# Patient Record
Sex: Male | Born: 1975 | Race: White | Hispanic: No | Marital: Married | Smoking: Never smoker
Health system: Southern US, Community
[De-identification: ages and names within clinical notes are randomized; demographics above are authoritative.]

## PROBLEM LIST (undated history)

## (undated) HISTORY — PX: APPENDECTOMY: SHX54

---

## 1999-08-05 ENCOUNTER — Emergency Department (HOSPITAL_COMMUNITY): Admission: EM | Admit: 1999-08-05 | Discharge: 1999-08-05 | Payer: Self-pay | Admitting: Emergency Medicine

## 1999-08-05 ENCOUNTER — Encounter: Payer: Self-pay | Admitting: Emergency Medicine

## 2016-07-15 ENCOUNTER — Other Ambulatory Visit: Payer: Self-pay | Admitting: Physician Assistant

## 2016-07-15 DIAGNOSIS — M5442 Lumbago with sciatica, left side: Principal | ICD-10-CM

## 2016-07-15 DIAGNOSIS — M5441 Lumbago with sciatica, right side: Secondary | ICD-10-CM

## 2016-07-18 ENCOUNTER — Ambulatory Visit
Admission: RE | Admit: 2016-07-18 | Discharge: 2016-07-18 | Disposition: A | Discharge status: Home / Self Care | Payer: Worker's Compensation | Source: Ambulatory Visit | Attending: Physician Assistant | Admitting: Physician Assistant

## 2016-07-18 DIAGNOSIS — M5441 Lumbago with sciatica, right side: Secondary | ICD-10-CM

## 2016-07-18 DIAGNOSIS — M5442 Lumbago with sciatica, left side: Principal | ICD-10-CM

## 2016-10-02 ENCOUNTER — Other Ambulatory Visit: Payer: Self-pay | Admitting: Physical Medicine and Rehabilitation

## 2016-10-02 DIAGNOSIS — S335XXA Sprain of ligaments of lumbar spine, initial encounter: Secondary | ICD-10-CM

## 2016-10-12 ENCOUNTER — Ambulatory Visit
Admission: RE | Admit: 2016-10-12 | Discharge: 2016-10-12 | Disposition: A | Discharge status: Home / Self Care | Payer: Worker's Compensation | Source: Ambulatory Visit | Attending: Physical Medicine and Rehabilitation | Admitting: Physical Medicine and Rehabilitation

## 2016-10-12 DIAGNOSIS — S335XXA Sprain of ligaments of lumbar spine, initial encounter: Secondary | ICD-10-CM

## 2017-10-09 IMAGING — MR MR LUMBAR SPINE W/O CM
4 of 5 series · 26 of 48 positions shown · non-contrast
Comparison: Lumbar spine MRI 07/18/2016

CLINICAL DATA: Fall with low back pain

EXAM:
MRI LUMBAR SPINE WITHOUT CONTRAST
TECHNIQUE: Multiplanar, multisequence MR imaging of the lumbar spine was
performed. No intravenous contrast was administered.

[Series 3: T2 post-contrast · sagittal · 4.0mm · 0.55mm/px · 6 of 15 slices shown]
[im 1/15]
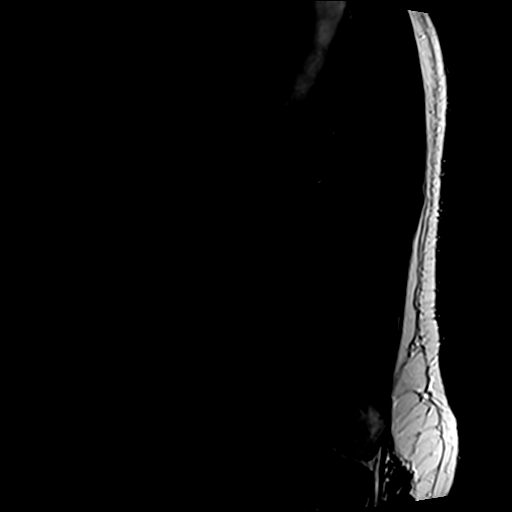
[im 3/15]
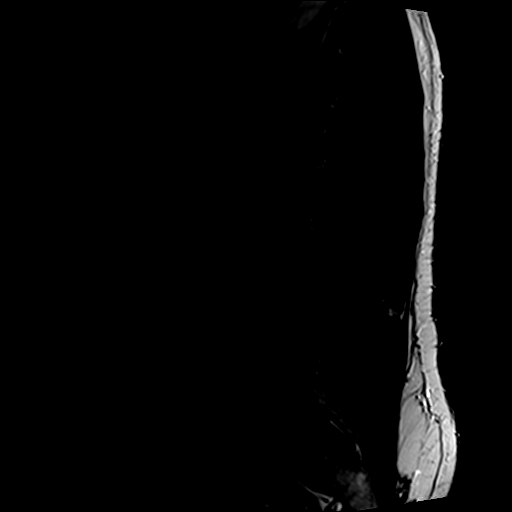
[im 6/15]
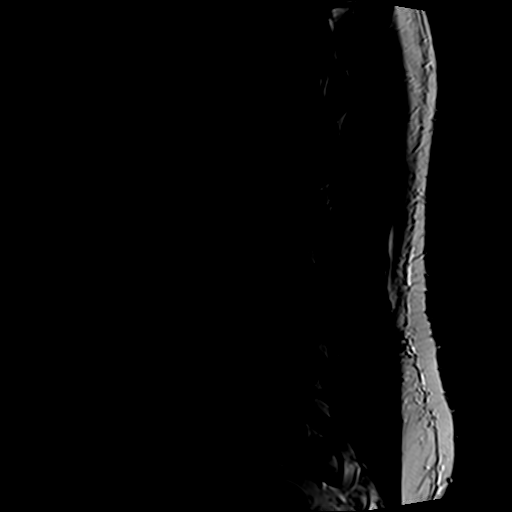
[im 9/15]
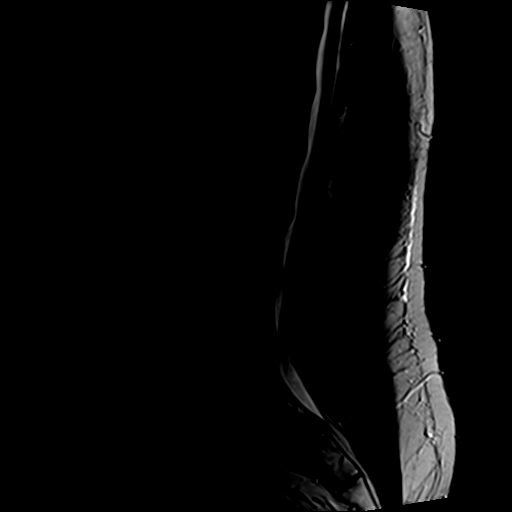
[im 12/15]
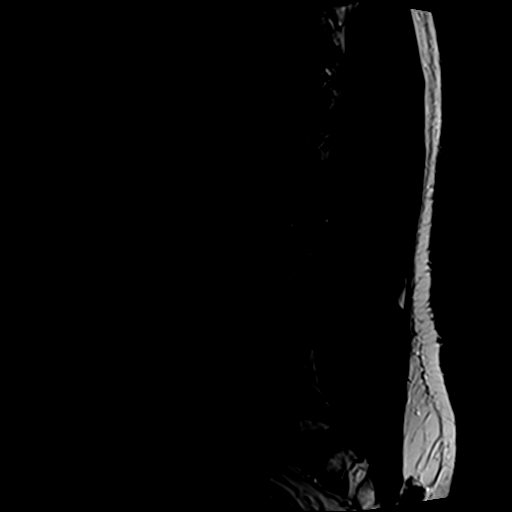
[im 15/15]
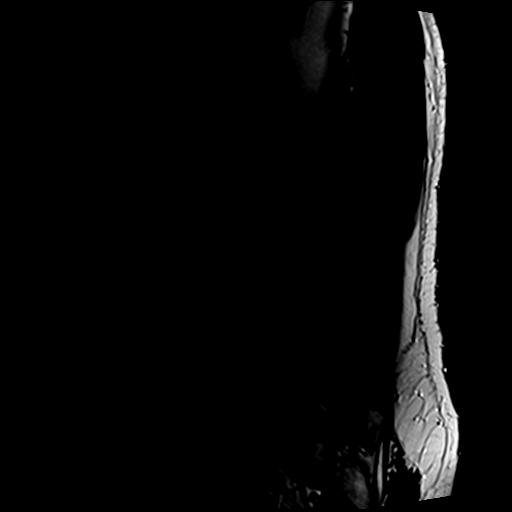

[Series 5: T1 · sagittal · 4.0mm · 0.55mm/px · 6 of 15 slices shown (1 of 2)]
[im 1/15]
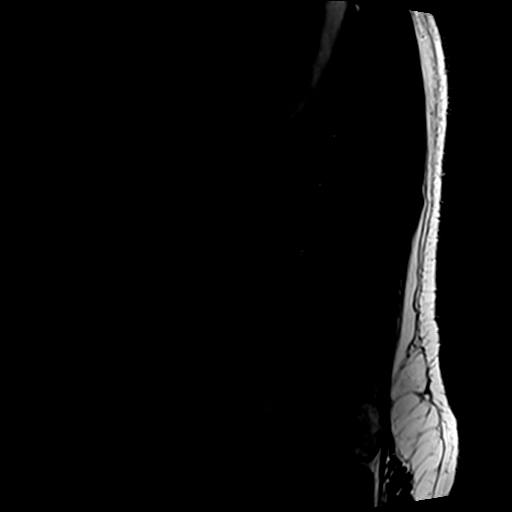
[im 3/15]
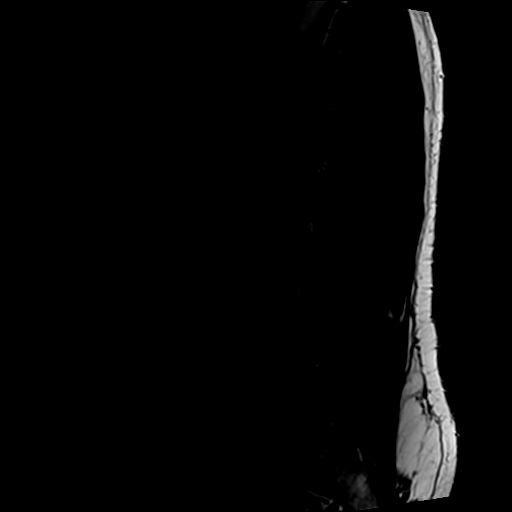
[im 6/15]
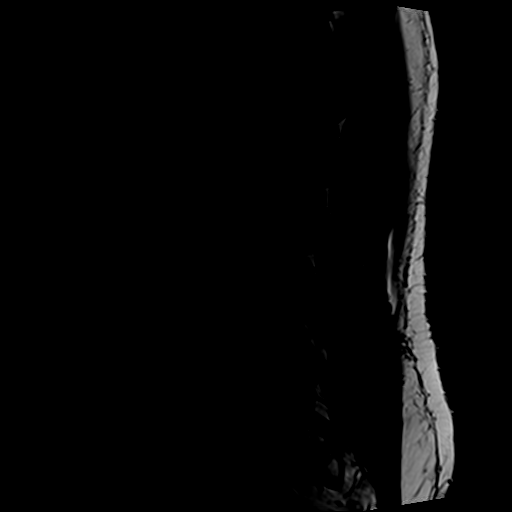
[im 9/15]
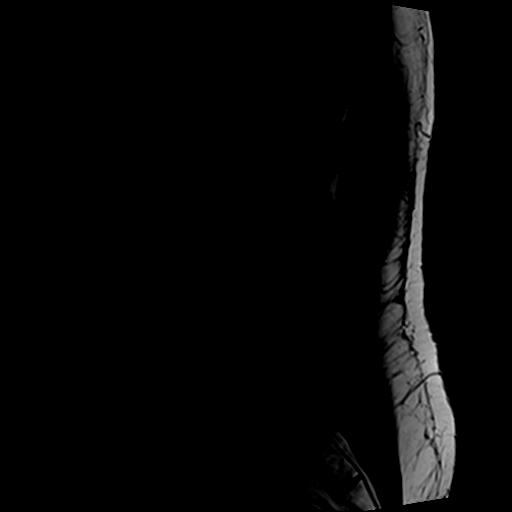
[im 12/15]
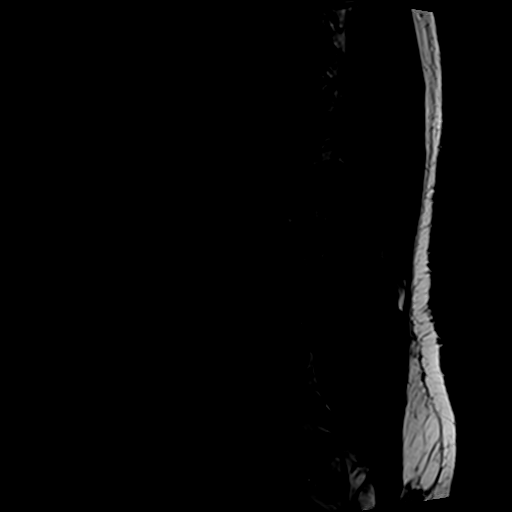
[im 15/15]
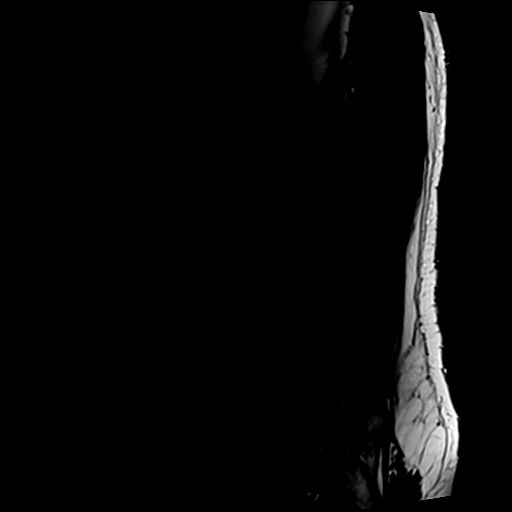

[Series 6: T1 · axial · 4.0mm · 0.35mm/px · z∈[-58,+104]mm · 5 of 35 slices shown (2 of 2)]
[im 1/35]
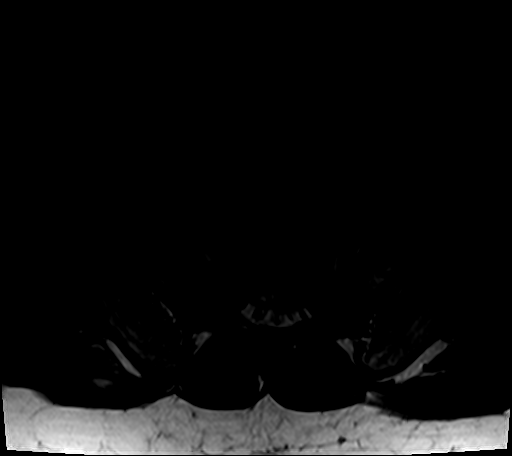
[im 5/35]
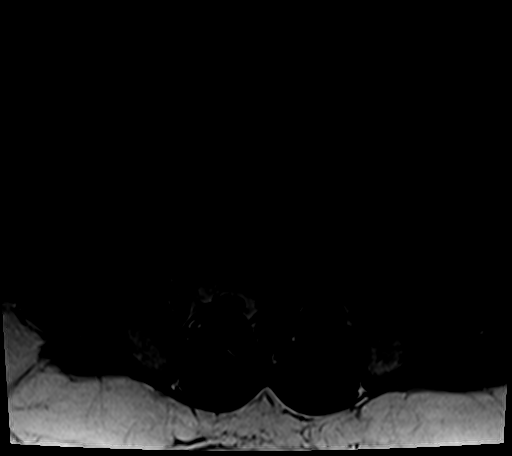
[im 10/35]
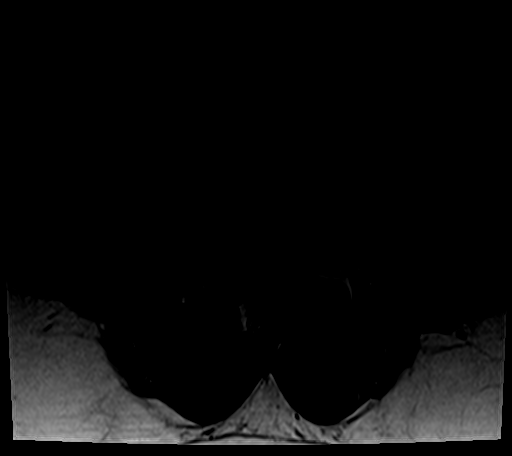
[im 18/35]
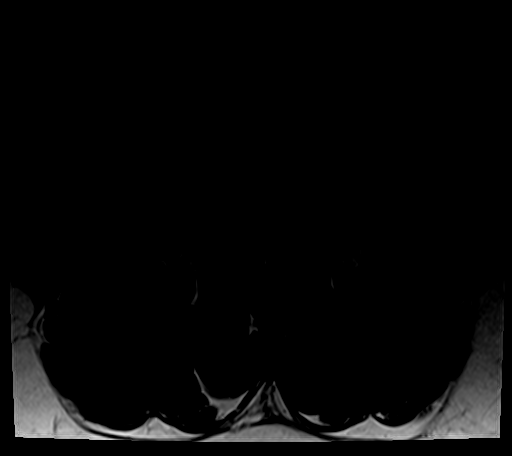
[im 30/35]
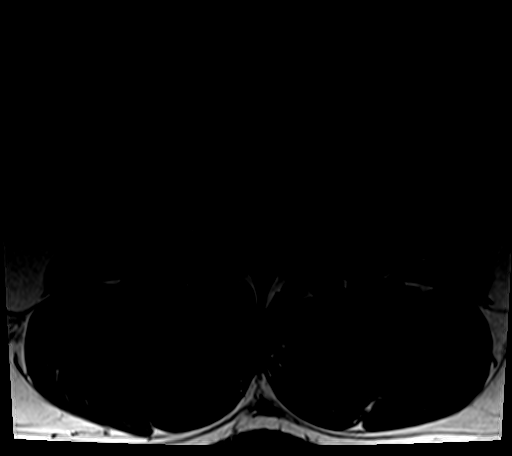

[Series 7: T2 · axial · 4.0mm · 0.70mm/px · z∈[-58,+130]mm · 9 of 35 slices shown]
[im 1/35]
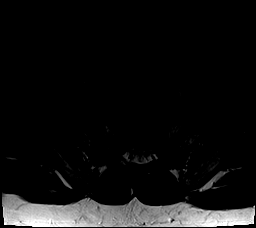
[im 5/35]
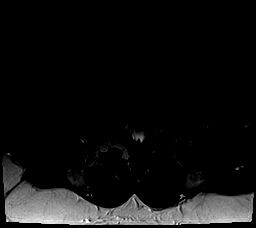
[im 10/35]
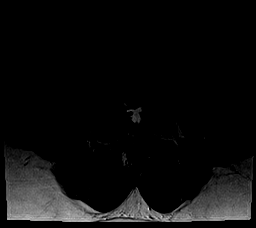
[im 15/35]
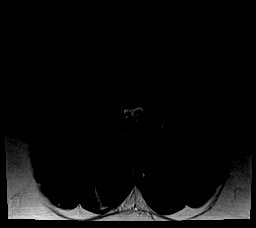
[im 18/35]
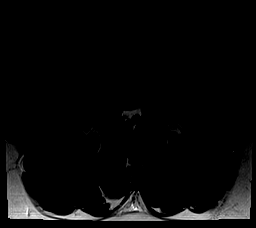
[im 20/35]
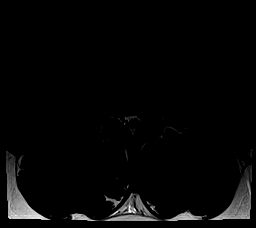
[im 25/35]
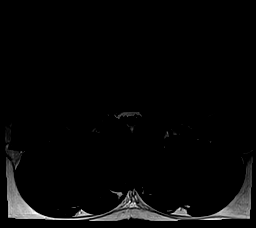
[im 30/35]
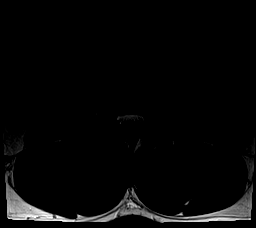
[im 35/35]
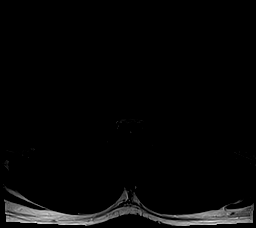

[26 of 48 positions shown; findings below may reference images not displayed]

FINDINGS: Segmentation:  Normal

Alignment:  Normal

Vertebrae: Unchanged multilevel vertebral body height loss with
limbus vertebrae at L3 and L4.

Conus medullaris: Extends to the L1 level and appears normal. There
is congenital narrowing of the spinal canal.

Paraspinal and other soft tissues: The visualized aorta, IVC and
iliac vessels are normal. The visualized retroperitoneal organs and
paraspinal soft tissues are normal.

Disc levels:

T12-L1: Normal disc space and facets. No spinal canal or
neuroforaminal stenosis.

L1-L2: Normal disc space and facets. No spinal canal or
neuroforaminal stenosis.

L2-L3: Disc desiccation with minimal bulge. No spinal canal or
neural foraminal stenosis.

L3-L4: Disc desiccation and small bulge. No spinal canal stenosis or
neural foraminal stenosis.

L4-L5: Right eccentric disc bulge narrows the right lateral recess,
slightly progressed from the prior study. Mild bilateral facet
hypertrophy. No central spinal canal stenosis. Severe right
foraminal stenosis, slightly progressed. Mild left foraminal
narrowing.

L5-S1: Disc desiccation with mild bulge. Unchanged appearance of
left facet and pars interarticularis. No spinal canal or neural
foraminal stenosis.

Visualized sacrum: Normal.
IMPRESSION: 1. Worsening of right eccentric disc bulge at L4-L5, which causes
narrowing of the right lateral recess. Correlate for right L5
radiculopathy. Additionally, severe right foraminal stenosis at this
level may be a source of right L4 radiculopathy.
2. Unchanged appearance of hypoplastic left L5 facet.

## 2019-07-20 ENCOUNTER — Encounter (HOSPITAL_COMMUNITY): Payer: Self-pay | Admitting: Emergency Medicine

## 2019-07-20 ENCOUNTER — Emergency Department (HOSPITAL_COMMUNITY)
Admission: EM | Admit: 2019-07-20 | Discharge: 2019-07-21 | Disposition: A | Discharge status: Home / Self Care | Payer: Self-pay | Attending: Emergency Medicine | Admitting: Emergency Medicine

## 2019-07-20 ENCOUNTER — Other Ambulatory Visit: Payer: Self-pay

## 2019-07-20 DIAGNOSIS — Z5321 Procedure and treatment not carried out due to patient leaving prior to being seen by health care provider: Secondary | ICD-10-CM | POA: Insufficient documentation

## 2019-07-20 DIAGNOSIS — M549 Dorsalgia, unspecified: Secondary | ICD-10-CM | POA: Insufficient documentation

## 2019-07-20 LAB — URINALYSIS, ROUTINE W REFLEX MICROSCOPIC
Bilirubin Urine: NEGATIVE
Glucose, UA: NEGATIVE mg/dL
Hgb urine dipstick: NEGATIVE
Ketones, ur: NEGATIVE mg/dL
Leukocytes,Ua: NEGATIVE
Nitrite: NEGATIVE
Protein, ur: 30 mg/dL — AB
Specific Gravity, Urine: 1.034 — ABNORMAL HIGH (ref 1.005–1.030)
pH: 5 (ref 5.0–8.0)

## 2019-07-20 LAB — BASIC METABOLIC PANEL
Anion gap: 11 (ref 5–15)
BUN: 13 mg/dL (ref 6–20)
CO2: 22 mmol/L (ref 22–32)
Calcium: 8.8 mg/dL — ABNORMAL LOW (ref 8.9–10.3)
Chloride: 107 mmol/L (ref 98–111)
Creatinine, Ser: 1.04 mg/dL (ref 0.61–1.24)
GFR calc Af Amer: >60 mL/min (ref >60)
GFR calc non Af Amer: >60 mL/min (ref >60)
Glucose, Bld: 103 mg/dL — ABNORMAL HIGH (ref 70–99)
Potassium: 3.8 mmol/L (ref 3.5–5.1)
Sodium: 140 mmol/L (ref 135–145)

## 2019-07-20 LAB — CBC
HCT: 45.4 % (ref 39.0–52.0)
Hemoglobin: 14.8 g/dL (ref 13.0–17.0)
MCH: 26.6 pg (ref 26.0–34.0)
MCHC: 32.6 g/dL (ref 30.0–36.0)
MCV: 81.5 fL (ref 80.0–100.0)
Platelets: 271 10*3/uL (ref 150–400)
RBC: 5.57 MIL/uL (ref 4.22–5.81)
RDW: 14.6 % (ref 11.5–15.5)
WBC: 4.1 10*3/uL (ref 4.0–10.5)
nRBC: 0 % (ref 0.0–0.2)

## 2019-07-20 NOTE — ED Triage Notes (Signed)
Pt reports lower back pain that is radiating down both legs and up his back to his shoulder blades for a few days. Endorses weakness in all extremities.

## 2019-07-20 NOTE — ED Notes (Signed)
Pt seen leaving emergency room with family member, Family to registration asking about wait times.

## 2022-12-07 ENCOUNTER — Emergency Department (HOSPITAL_BASED_OUTPATIENT_CLINIC_OR_DEPARTMENT_OTHER)
Admission: EM | Admit: 2022-12-07 | Discharge: 2022-12-07 | Disposition: A | Discharge status: Home / Self Care | Payer: No Typology Code available for payment source | Attending: Emergency Medicine | Admitting: Emergency Medicine

## 2022-12-07 ENCOUNTER — Emergency Department (HOSPITAL_BASED_OUTPATIENT_CLINIC_OR_DEPARTMENT_OTHER): Payer: Managed Care, Other (non HMO)

## 2022-12-07 ENCOUNTER — Other Ambulatory Visit: Payer: Self-pay

## 2022-12-07 ENCOUNTER — Encounter (HOSPITAL_BASED_OUTPATIENT_CLINIC_OR_DEPARTMENT_OTHER): Payer: Self-pay

## 2022-12-07 DIAGNOSIS — W231XXA Caught, crushed, jammed, or pinched between stationary objects, initial encounter: Secondary | ICD-10-CM | POA: Insufficient documentation

## 2022-12-07 DIAGNOSIS — S6991XA Unspecified injury of right wrist, hand and finger(s), initial encounter: Secondary | ICD-10-CM | POA: Diagnosis not present

## 2022-12-07 MED ORDER — ACETAMINOPHEN ER 650 MG PO TBCR: 650.0000 mg | EXTENDED_RELEASE_TABLET | Freq: Three times a day (TID) | ORAL | 0 refills | Status: AC | PRN

## 2022-12-07 MED ORDER — ACETAMINOPHEN 325 MG PO TABS
650.0000 mg | ORAL_TABLET | Freq: Once | ORAL | Status: AC
  Administered 2022-12-07: 650 mg via ORAL
  Filled 2022-12-07: qty 2

## 2022-12-07 NOTE — ED Notes (Signed)
ED Provider at bedside. 

## 2022-12-07 NOTE — Discharge Instructions (Signed)
X-ray imaging was negative for acute fracture and your physical exam was reassuring.  Recommend ice, NSAIDs and Tylenol for pain control.

## 2022-12-07 NOTE — ED Triage Notes (Addendum)
Pt reports that between 0130 and 0230 he injured his RT hand at work. A metal door on a truck hit his hand. Pt states he cannot ball his hand up in a fist. Took Aleve 1 hour ago

## 2022-12-07 NOTE — ED Provider Notes (Signed)
[place] [facility] Provider Note   CSN: [id number] Arrival date & time: 12/07/22  4098     History  Chief Complaint  Patient presents with   Hand Injury    William Petersen is a 47 y.o. male.   Hand Injury    47 year old male presenting to the emergency department with an injury to his right hand.  The patient states that overnight he got his hand slammed on 8 metal door.  He initially had pain and difficulty ranging his fingers and bawling his hand but that has since resolved.  He denies any numbness or weakness to the hand.  He denies any other injuries or trauma.  Home Medications Prior to Admission medications   Medication Sig Start Date End Date Taking? Authorizing Provider  acetaminophen (TYLENOL 8 HOUR) 650 MG CR tablet Take 1 tablet (650 mg total) by mouth every 8 (eight) hours as needed for up to 5 days for pain. 12/07/22 12/12/22 Yes Ernie Avena, MD      Allergies    Patient has no allergy information on record.    Review of Systems   Review of Systems  All other systems reviewed and are negative.   Physical Exam Updated Vital Signs BP 123/86   Pulse 66   Temp 97.8 F (36.6 C) (Oral)   Resp 16   Ht 6' (1.829 m)   Wt 106.6 kg   SpO2 95%   BMI 31.87 kg/m  Physical Exam Vitals and nursing note reviewed.  Constitutional:      General: He is not in acute distress. HENT:     Head: Normocephalic and atraumatic.  Eyes:     Conjunctiva/sclera: Conjunctivae normal.     Pupils: Pupils are equal, round, and reactive to light.  Cardiovascular:     Rate and Rhythm: Normal rate and regular rhythm.  Pulmonary:     Effort: Pulmonary effort is normal. No respiratory distress.  Abdominal:     General: There is no distension.     Tenderness: There is no guarding.  Musculoskeletal:        General: No deformity or signs of injury.     Cervical back: Neck supple.     Comments: No tenderness about the metacarpals of the hand or  wrist, distally neurovascularly intact with intact motor function along the median, ulnar, radial nerve distribution, no scaphoid tenderness.  Skin:    Findings: No lesion or rash.  Neurological:     General: No focal deficit present.     Mental Status: He is alert. Mental status is at baseline.     ED Results / Procedures / Treatments   Labs (all labs ordered are listed, but only abnormal results are displayed) Labs Reviewed - No data to display  EKG None  Radiology DG Hand Complete Right  Result Date: 12/07/2022 CLINICAL DATA:  Right hand injury.  Pain. EXAM: RIGHT HAND - COMPLETE 3+ VIEW COMPARISON:  None Available. FINDINGS: There is no evidence of fracture or dislocation. There is no evidence of arthropathy or other focal bone abnormality. Soft tissues are unremarkable. IMPRESSION: Negative. Electronically Signed   By: Kennith Center M.D.   On: 12/07/2022 07:09    Procedures Procedures    Medications Ordered in ED Medications  acetaminophen (TYLENOL) tablet 650 mg (650 mg Oral Given 12/07/22 1191)    ED Course/ Medical Decision Making/ A&P  Medical Decision Making Amount and/or Complexity of Data Reviewed Radiology: ordered.  Risk OTC drugs.     47 year old male presenting to the emergency department with an injury to his right hand.  The patient states that overnight he got his hand slammed on 8 metal door.  He initially had pain and difficulty ranging his fingers and bawling his hand but that has since resolved.  He denies any numbness or weakness to the hand.  He denies any other injuries or trauma.  On arrival, the patient was vitally stable, right hand was neurovascular intact with no tenderness on exam following Tylenol.  X-ray imaging of the right hand was performed from triage and was negative for acute fracture or dislocation.  Patient without evidence of other traumatic injury, advised Tylenol and ibuprofen for pain control, rest,  ice of the extremity and follow-up routinely outpatient.  Stable for discharge.   Final Clinical Impression(s) / ED Diagnoses Final diagnoses:  Injury of right hand, initial encounter    Rx / DC Orders ED Discharge Orders          Ordered    acetaminophen (TYLENOL 8 HOUR) 650 MG CR tablet  Every 8 hours PRN        12/07/22 0949              Ernie Avena, MD 12/07/22 [phone]
